# Patient Record
Sex: Male | Born: 1960 | Race: Asian | Hispanic: No | Marital: Married
Health system: Southern US, Community
[De-identification: ages and names within clinical notes are randomized; demographics above are authoritative.]

---

## 2008-06-27 ENCOUNTER — Emergency Department (HOSPITAL_COMMUNITY): Admission: EM | Admit: 2008-06-27 | Discharge: 2008-06-27 | Payer: Self-pay | Admitting: Emergency Medicine

## 2008-08-02 ENCOUNTER — Encounter: Admission: RE | Admit: 2008-08-02 | Discharge: 2008-08-02 | Payer: Self-pay | Admitting: Family Medicine

## 2009-04-09 IMAGING — US US ABDOMEN COMPLETE
1 series · 14 of 25 positions shown · non-contrast
Comparison: None

CLINICAL DATA: Elevated LFTs.  Hypertension.

ABDOMEN ULTRASOUND
TECHNIQUE: Complete abdominal ultrasound examination was performed
including evaluation of the liver, gallbladder, bile ducts,
pancreas, kidneys, spleen, IVC, and abdominal aorta.

[Series 1: us abdomen complete · 0.25mm/px · 14 of 98 slices shown]
[im 1/98]
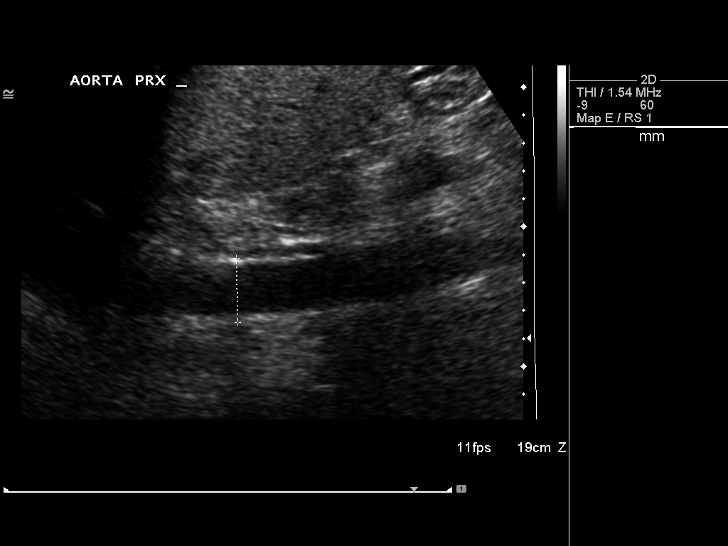
[im 9/98]
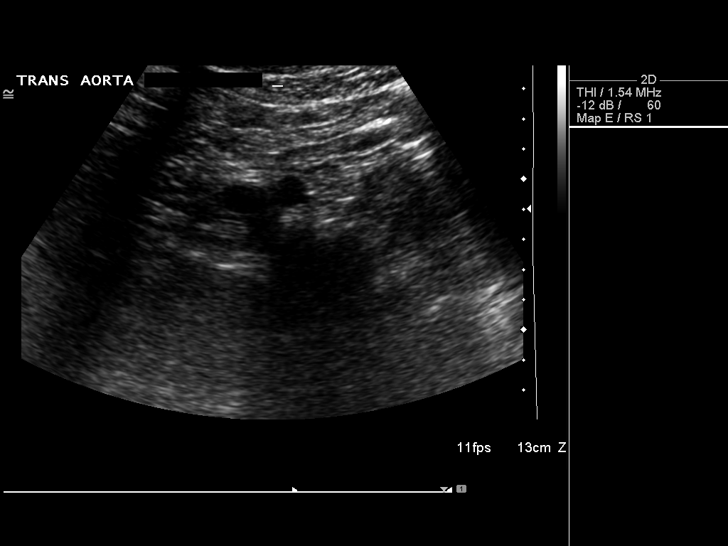
[im 17/98]
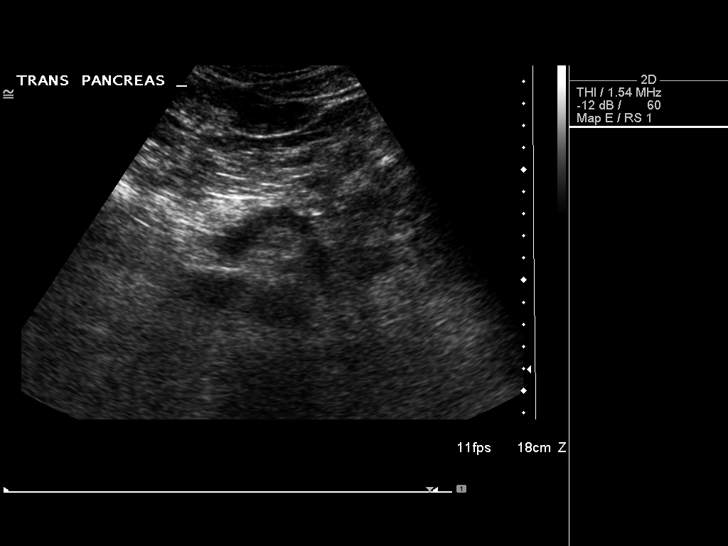
[im 25/98]
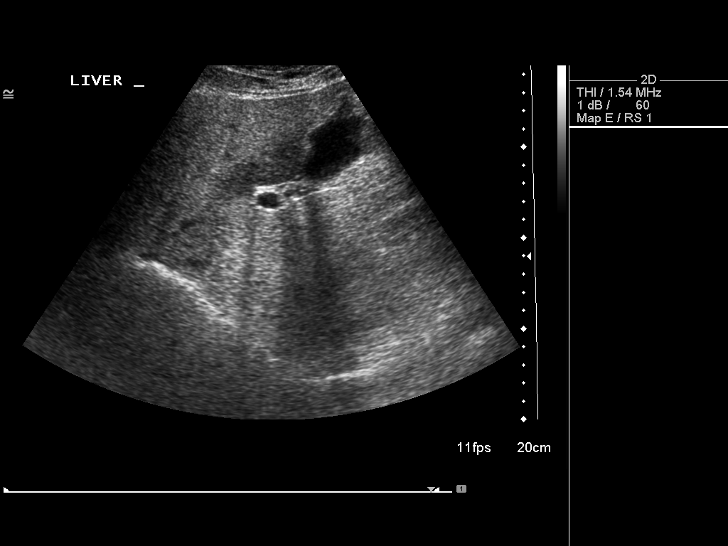
[im 33/98]
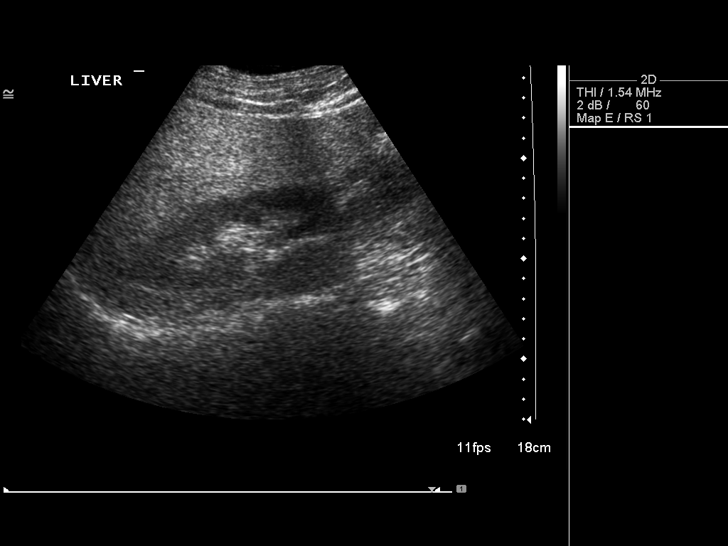
[im 37/98]
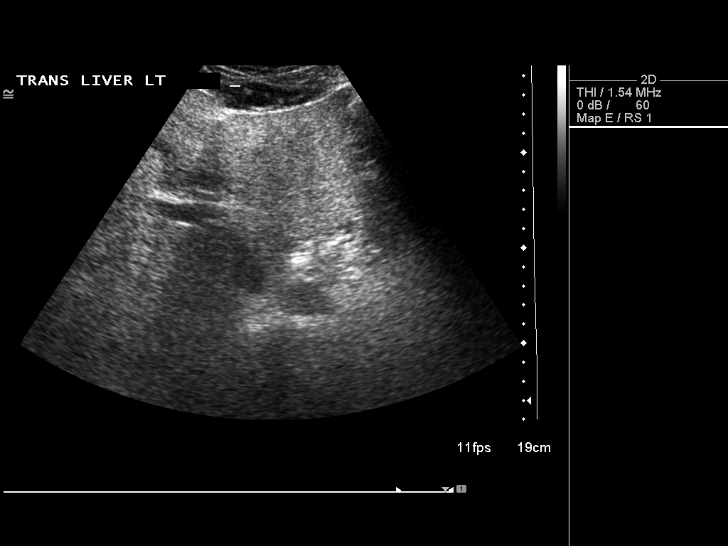
[im 45/98]
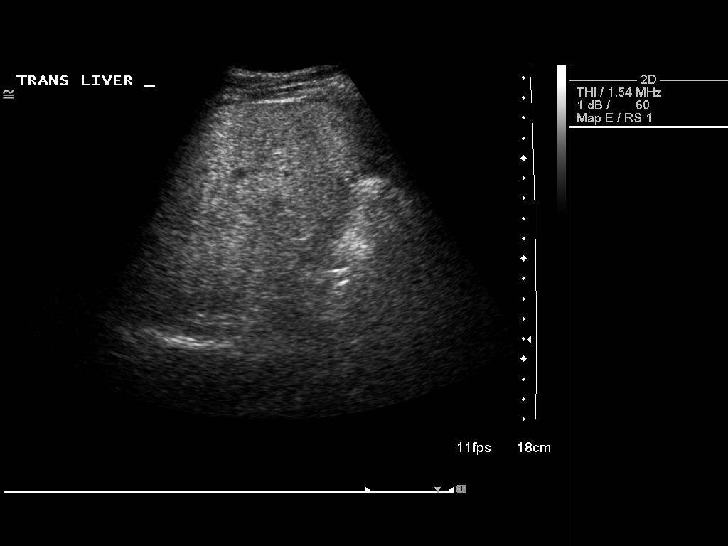
[im 53/98]
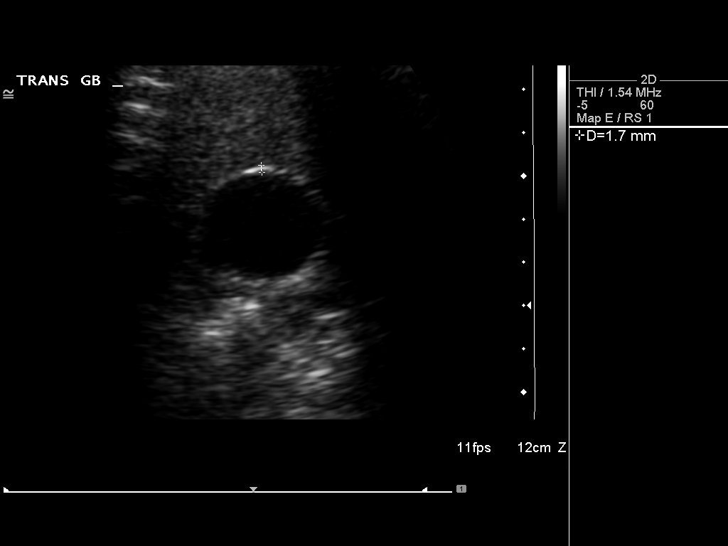
[im 61/98]
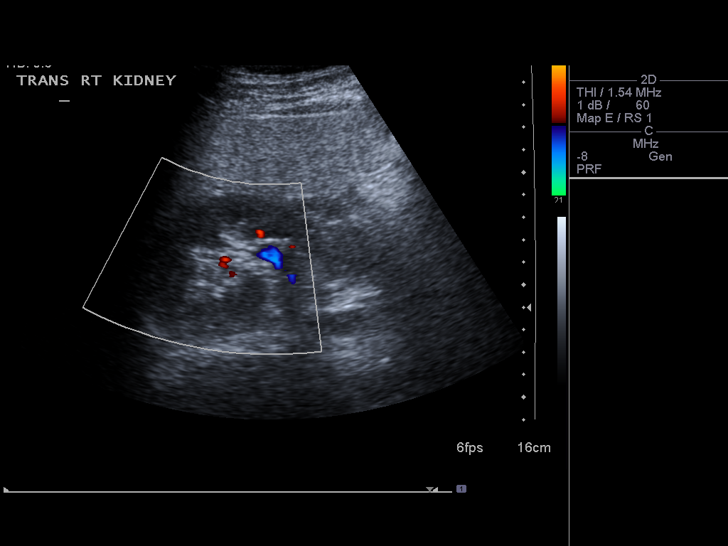
[im 65/98]
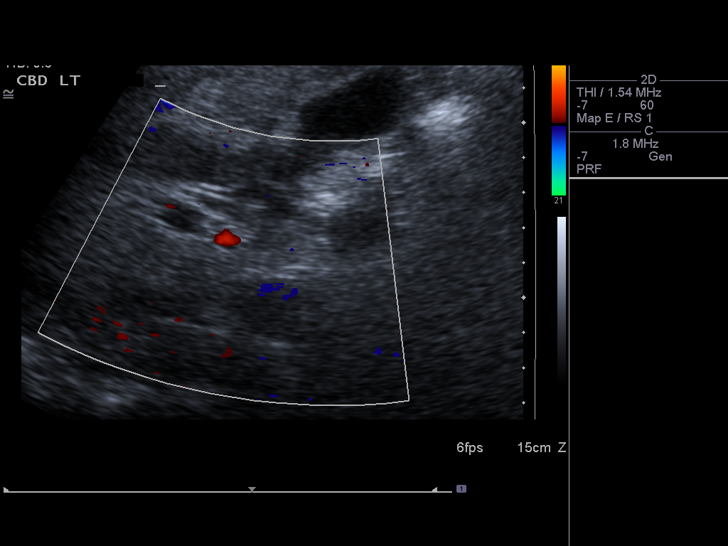
[im 73/98]
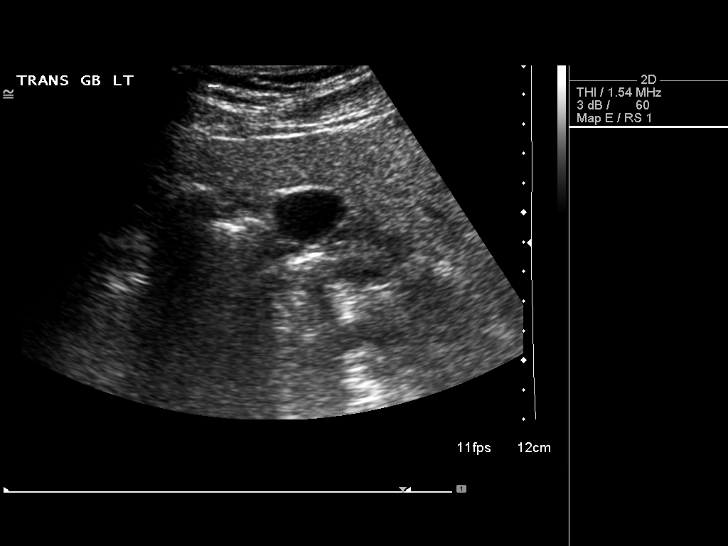
[im 81/98]
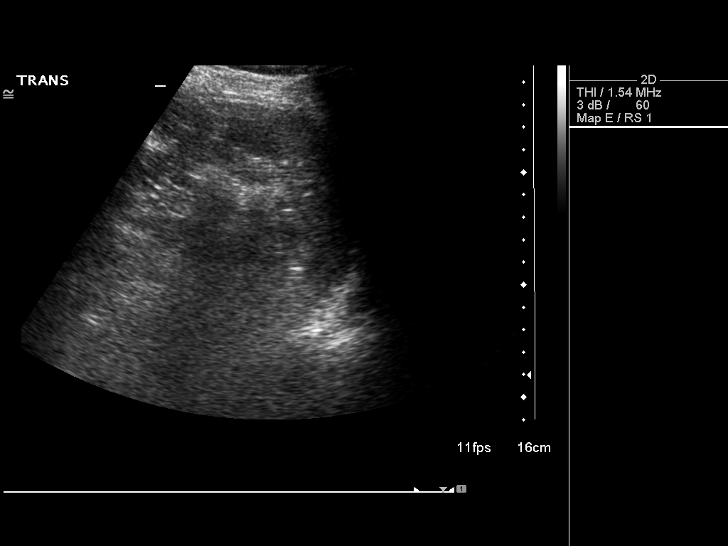
[im 89/98]
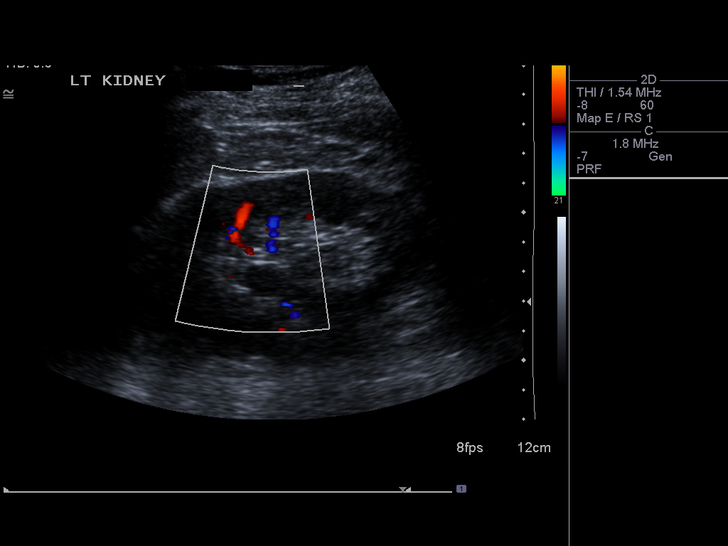
[im 98/98]
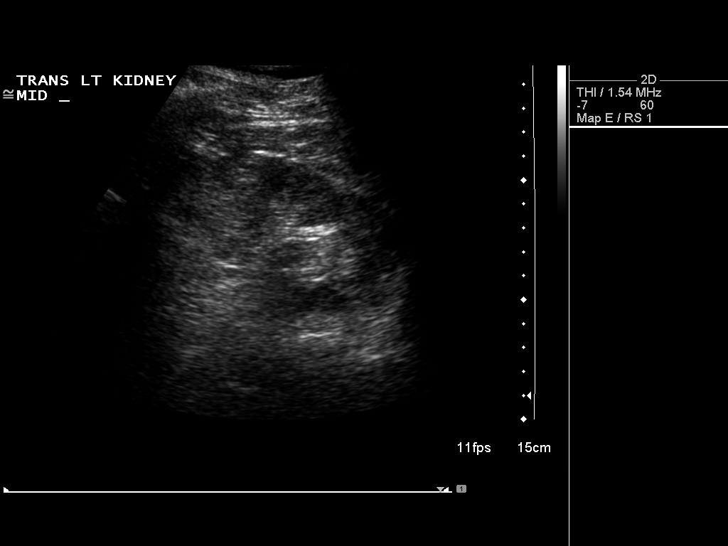

[14 of 25 positions shown; findings below may reference images not displayed]

FINDINGS: Gallbladder wall thickness is 1.7 mm.  No gallstones.
Common duct measures 4.3 mm in diameter i.e. there is no biliary
duct dilatation.  Diffuse increase in hepatic echogenicity and
slight inhomogeneity of the echotexture.  Findings compatible with
fatty infiltration of the liver.  There may be a small area of
focal fatty sparing in the mid aspect of the liver.  Limited
visualization of the pancreas.  Patent IVC.  Spleen measures 7.1 cm
in length.  The right and left kidneys measure 11.0 11.1 cm in
length, respectively.  Left parapelvic cysts the largest measuring
1.7 x 1.2 x 1.8 cm.  Maximum diameter abdominal aorta is 2.3 cm.
IMPRESSION: Normal gallbladder.  No biliary ductal dilatation.  Fatty
infiltration of the liver.  Suboptimal visualization of the
pancreas.

## 2011-04-09 LAB — POCT I-STAT, CHEM 8
BUN: 19 mg/dL (ref 6–23)
Calcium, Ion: 1.18 mmol/L (ref 1.12–1.32)
Chloride: 105 mEq/L (ref 96–112)
Creatinine, Ser: 1 mg/dL (ref 0.4–1.5)
Glucose, Bld: 83 mg/dL (ref 70–99)
Potassium: 3.8 mEq/L (ref 3.5–5.1)

## 2011-04-09 LAB — POCT CARDIAC MARKERS
CKMB, poc: 1.2 ng/mL (ref 1.0–8.0)
CKMB, poc: 1.5 ng/mL (ref 1.0–8.0)
Myoglobin, poc: 104 ng/mL (ref 12–200)
Troponin i, poc: <0.05 ng/mL (ref 0.00–0.09)
Troponin i, poc: <0.05 ng/mL (ref 0.00–0.09)

## 2011-04-09 LAB — CBC
HCT: 44.9 % (ref 39.0–52.0)
Hemoglobin: 14.8 g/dL (ref 13.0–17.0)
RBC: 4.95 MIL/uL (ref 4.22–5.81)
RDW: 13.3 % (ref 11.5–15.5)

## 2011-04-09 LAB — DIFFERENTIAL
Eosinophils Relative: 2 % (ref 0–5)
Lymphocytes Relative: 27 % (ref 12–46)
Monocytes Absolute: 0.8 10*3/uL (ref 0.1–1.0)
Monocytes Relative: 10 % (ref 3–12)
Neutro Abs: 4.8 10*3/uL (ref 1.7–7.7)

## 2012-06-23 ENCOUNTER — Other Ambulatory Visit: Payer: Self-pay | Admitting: Family Medicine

## 2012-06-23 DIAGNOSIS — R51 Headache: Secondary | ICD-10-CM

## 2012-06-23 DIAGNOSIS — H9202 Otalgia, left ear: Secondary | ICD-10-CM

## 2012-06-26 ENCOUNTER — Other Ambulatory Visit: Payer: Self-pay

## 2012-06-29 ENCOUNTER — Ambulatory Visit
Admission: RE | Admit: 2012-06-29 | Discharge: 2012-06-29 | Disposition: A | Discharge status: Home / Self Care | Payer: BC Managed Care – PPO | Source: Ambulatory Visit | Attending: Family Medicine | Admitting: Family Medicine

## 2012-06-29 DIAGNOSIS — R51 Headache: Secondary | ICD-10-CM

## 2012-06-29 DIAGNOSIS — H9202 Otalgia, left ear: Secondary | ICD-10-CM

## 2012-06-29 MED ORDER — GADOBENATE DIMEGLUMINE 529 MG/ML IV SOLN
14.0000 mL | Freq: Once | INTRAVENOUS | Status: AC | PRN
  Administered 2012-06-29: 14 mL via INTRAVENOUS

## 2012-06-30 ENCOUNTER — Other Ambulatory Visit: Payer: Self-pay

## 2013-05-15 ENCOUNTER — Other Ambulatory Visit: Payer: Self-pay | Admitting: Family Medicine

## 2013-05-15 ENCOUNTER — Ambulatory Visit
Admission: RE | Admit: 2013-05-15 | Discharge: 2013-05-15 | Disposition: A | Discharge status: Home / Self Care | Payer: BC Managed Care – PPO | Source: Ambulatory Visit | Attending: Family Medicine | Admitting: Family Medicine

## 2013-05-15 DIAGNOSIS — M545 Low back pain, unspecified: Secondary | ICD-10-CM

## 2015-07-24 ENCOUNTER — Other Ambulatory Visit: Payer: Self-pay

## 2017-03-31 ENCOUNTER — Other Ambulatory Visit: Payer: Self-pay | Admitting: Family Medicine

## 2017-04-28 ENCOUNTER — Other Ambulatory Visit: Payer: Self-pay | Admitting: Family Medicine

## 2017-04-28 DIAGNOSIS — R7401 Elevation of levels of liver transaminase levels: Secondary | ICD-10-CM

## 2017-04-28 DIAGNOSIS — R740 Nonspecific elevation of levels of transaminase and lactic acid dehydrogenase [LDH]: Principal | ICD-10-CM

## 2017-05-06 ENCOUNTER — Ambulatory Visit
Admission: RE | Admit: 2017-05-06 | Discharge: 2017-05-06 | Disposition: A | Discharge status: Home / Self Care | Payer: 59 | Source: Ambulatory Visit | Attending: Family Medicine | Admitting: Family Medicine

## 2017-05-06 DIAGNOSIS — R740 Nonspecific elevation of levels of transaminase and lactic acid dehydrogenase [LDH]: Principal | ICD-10-CM

## 2017-05-06 DIAGNOSIS — R7401 Elevation of levels of liver transaminase levels: Secondary | ICD-10-CM

## 2018-04-16 IMAGING — US US ABDOMEN LIMITED
1 series · 14 of 25 positions shown · non-contrast
Comparison: Ultrasound 08/02/2008

CLINICAL DATA: Elevated ALT

EXAM:
ULTRASOUND ABDOMEN LIMITED RIGHT UPPER QUADRANT

[Series 1: us abdomen limited · 0.17mm/px · 14 of 47 slices shown]
[im 1/47]
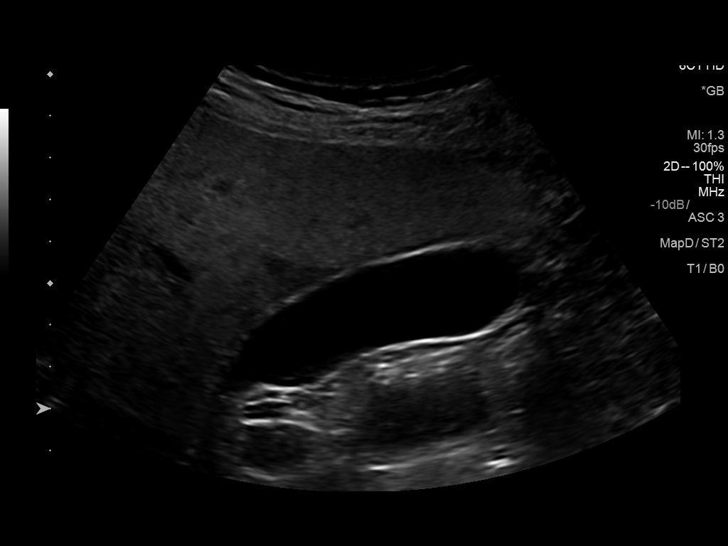
[im 4/47]
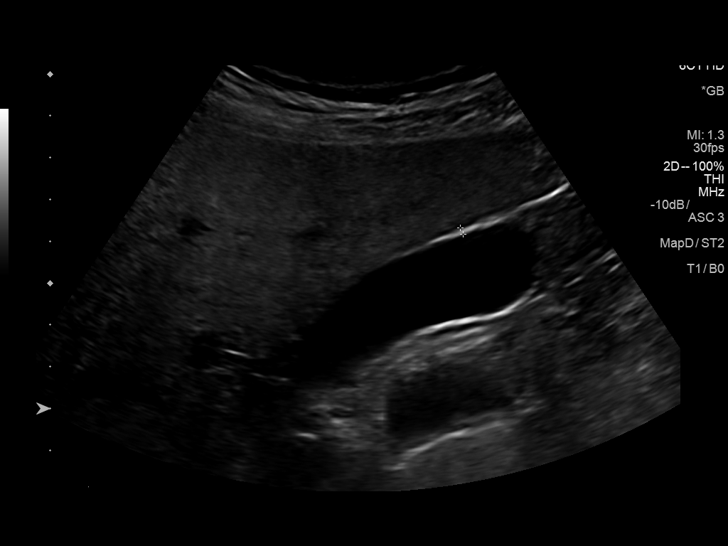
[im 8/47]
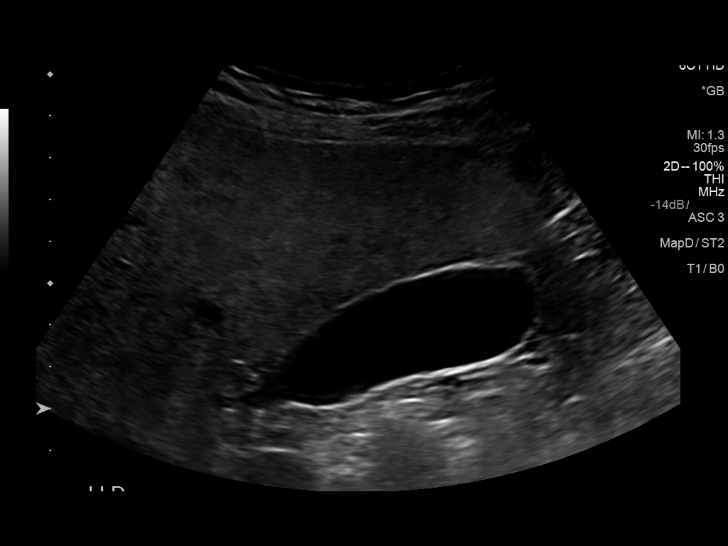
[im 12/47]
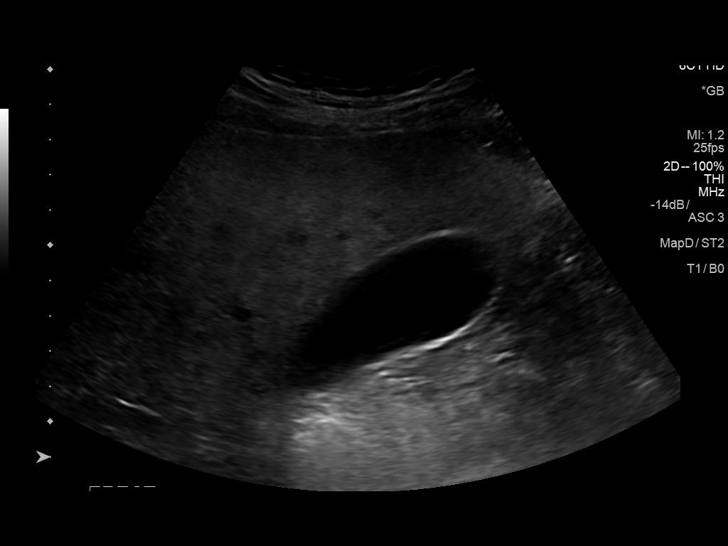
[im 16/47]
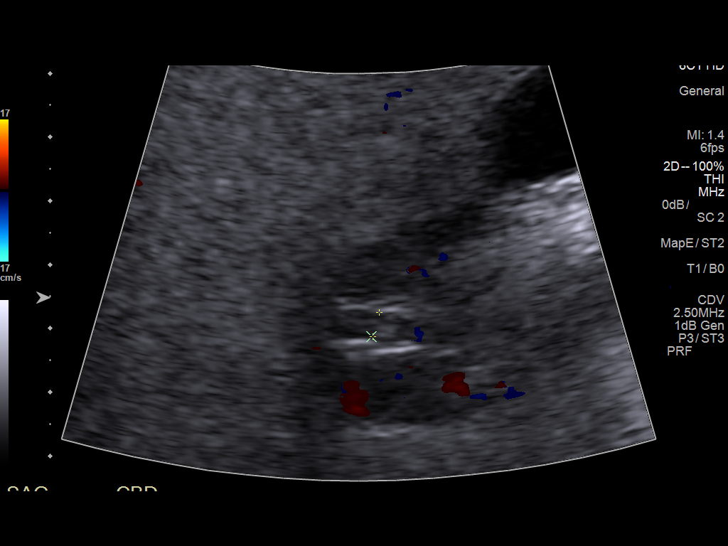
[im 18/47]
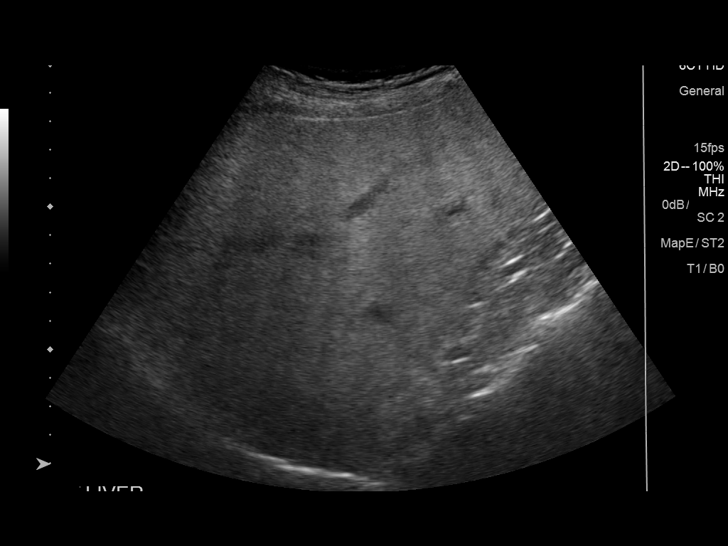
[im 22/47]
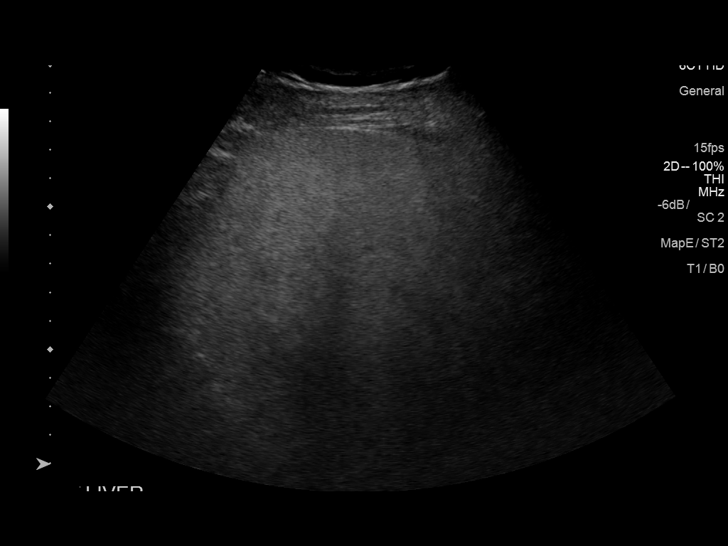
[im 25/47]
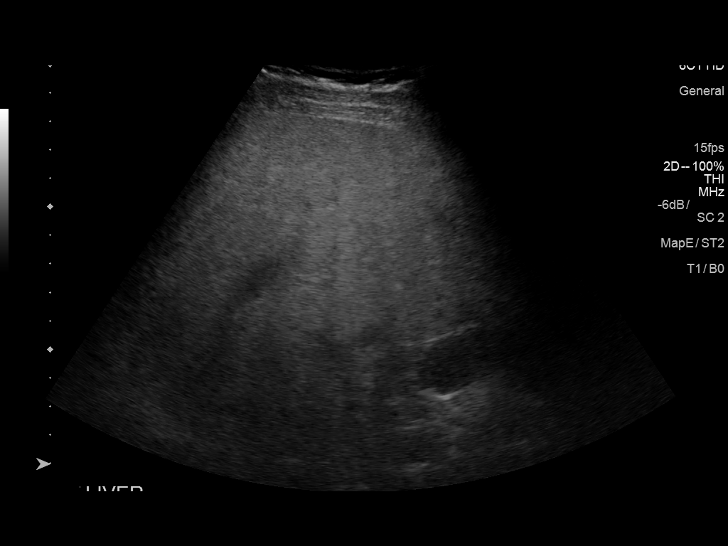
[im 29/47]
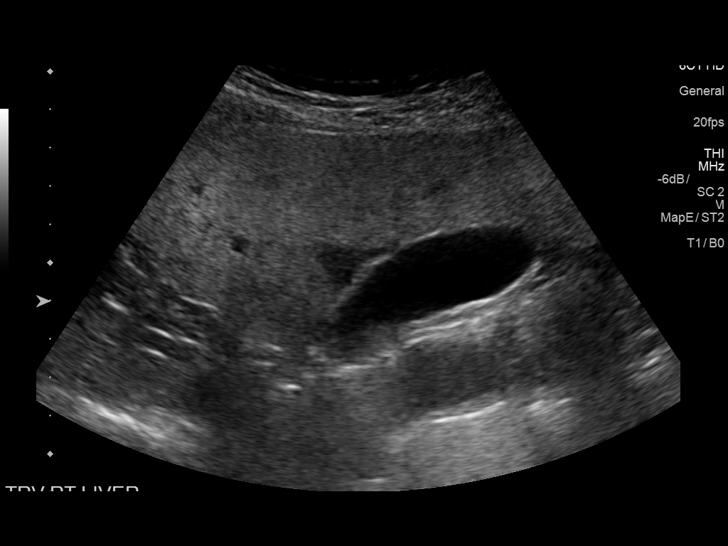
[im 31/47]
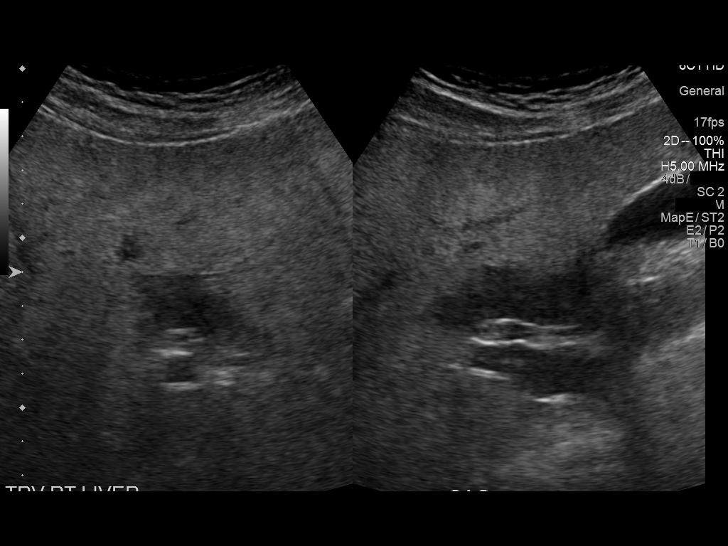
[im 35/47]
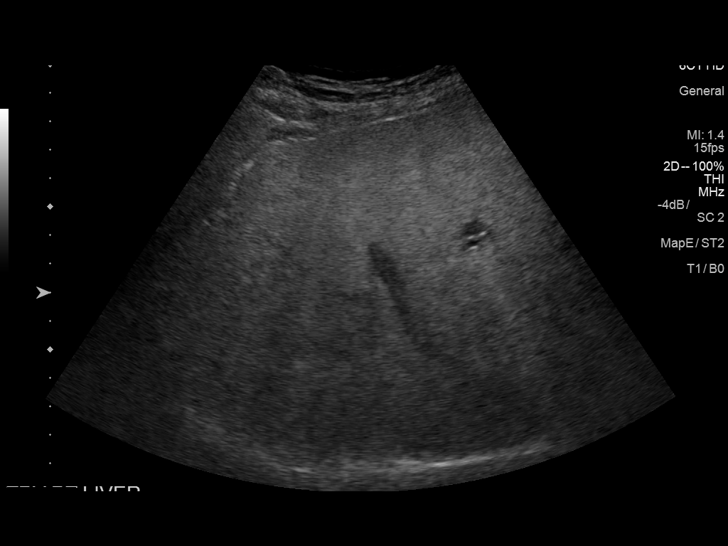
[im 39/47]
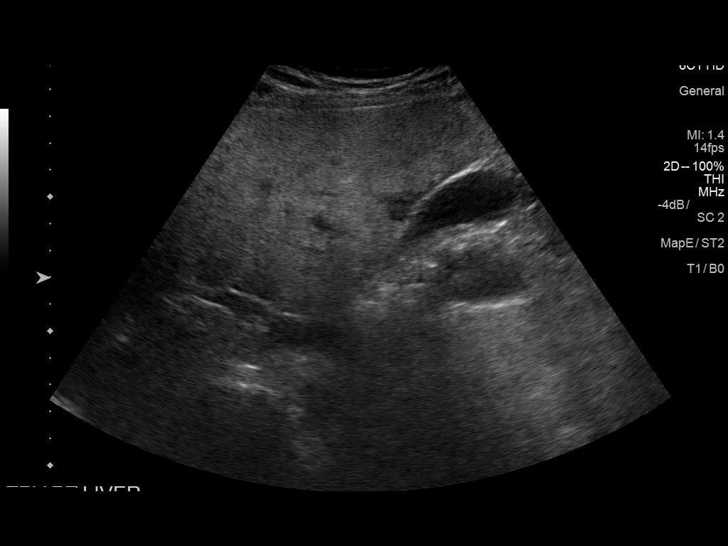
[im 43/47]
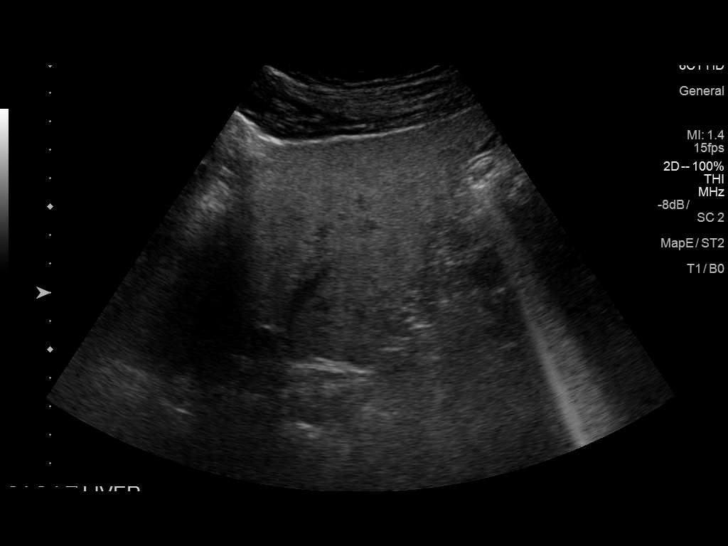
[im 47/47]
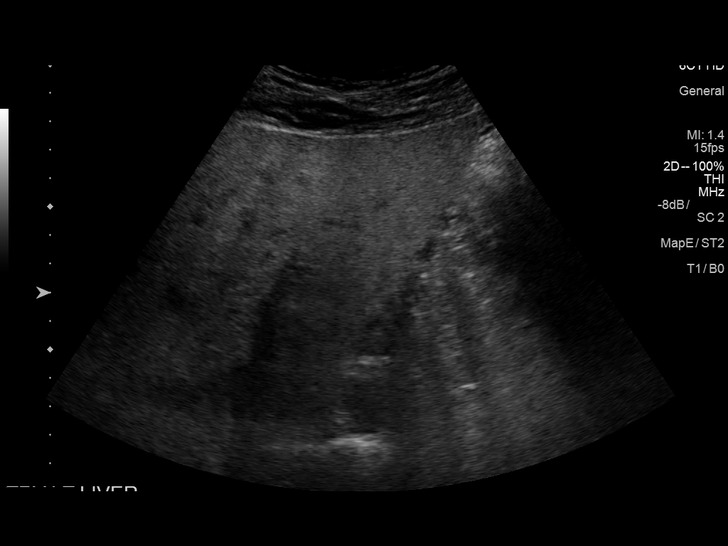

[14 of 25 positions shown; findings below may reference images not displayed]

FINDINGS: Gallbladder:

No gallstones or wall thickening visualized. No sonographic Murphy
sign noted by sonographer.

Common bile duct:

Diameter: Normal caliber, 4 mm

Liver:

Increased echotexture compatible with fatty infiltration. No focal
abnormality or biliary ductal dilatation. Areas of focal fatty
sparing in the right hepatic lobe adjacent to the gallbladder fossa.
Portal vein is patent on color Doppler imaging with normal direction
of blood flow towards the liver.
IMPRESSION: Fatty infiltration of the liver with areas of focal fatty sparing.

No acute findings.

## 2019-09-17 ENCOUNTER — Ambulatory Visit: Payer: 59 | Attending: Internal Medicine

## 2019-09-17 DIAGNOSIS — Z23 Encounter for immunization: Secondary | ICD-10-CM

## 2019-09-17 NOTE — Progress Notes (Signed)
   [facility]  Name:  Dale Fuller    MRN: [id number] DOB: [birth year]  09/17/2019  [name] was observed post Covid-19 immunization for 15 minutes without incident. He was provided with Vaccine Information Sheet and instruction to access the [employer]-Safe system.   [name] was instructed to call 911 with any severe reactions post vaccine: Marland Kitchen Difficulty breathing  . Swelling of face and throat  . A fast heartbeat  . A bad rash all over body  . Dizziness and weakness   Immunizations Administered    Name Date Dose VIS Date Route   Pfizer COVID-19 Vaccine 09/17/2019  5:12 PM 0.3 mL 06/15/2019 Intramuscular   Manufacturer: [employer], [employer]   Lot: [id number]   NDC: [address]-4

## 2023-08-09 ENCOUNTER — Other Ambulatory Visit: Payer: Self-pay | Admitting: Family Medicine

## 2023-08-09 DIAGNOSIS — R7401 Elevation of levels of liver transaminase levels: Secondary | ICD-10-CM

## 2023-08-11 ENCOUNTER — Ambulatory Visit
Admission: RE | Admit: 2023-08-11 | Discharge: 2023-08-11 | Disposition: A | Discharge status: Home / Self Care | Payer: 59 | Source: Ambulatory Visit | Attending: Family Medicine | Admitting: Family Medicine

## 2023-08-11 DIAGNOSIS — R7401 Elevation of levels of liver transaminase levels: Secondary | ICD-10-CM
